# Patient Record
Sex: Female | Born: 1975 | Race: White | Hispanic: No | Marital: Married | Smoking: Never smoker
Health system: Southern US, Community
[De-identification: ages and names within clinical notes are randomized; demographics above are authoritative.]

## PROBLEM LIST (undated history)

## (undated) DIAGNOSIS — F419 Anxiety disorder, unspecified: Secondary | ICD-10-CM

## (undated) DIAGNOSIS — F32A Depression, unspecified: Secondary | ICD-10-CM

## (undated) DIAGNOSIS — G43909 Migraine, unspecified, not intractable, without status migrainosus: Secondary | ICD-10-CM

## (undated) HISTORY — PX: GASTRIC BYPASS: SHX52

---

## 2014-05-15 ENCOUNTER — Other Ambulatory Visit: Payer: Self-pay | Admitting: Obstetrics and Gynecology

## 2014-05-16 ENCOUNTER — Encounter (HOSPITAL_COMMUNITY): Payer: Self-pay | Admitting: Pharmacist

## 2014-05-18 DIAGNOSIS — IMO0002 Reserved for concepts with insufficient information to code with codable children: Secondary | ICD-10-CM

## 2014-05-18 DIAGNOSIS — O09529 Supervision of elderly multigravida, unspecified trimester: Secondary | ICD-10-CM | POA: Diagnosis present

## 2014-05-18 DIAGNOSIS — Z8759 Personal history of other complications of pregnancy, childbirth and the puerperium: Secondary | ICD-10-CM

## 2014-05-18 DIAGNOSIS — O09299 Supervision of pregnancy with other poor reproductive or obstetric history, unspecified trimester: Secondary | ICD-10-CM

## 2014-05-18 DIAGNOSIS — O034 Incomplete spontaneous abortion without complication: Secondary | ICD-10-CM | POA: Diagnosis present

## 2014-05-18 DIAGNOSIS — Z641 Problems related to multiparity: Secondary | ICD-10-CM

## 2014-05-18 DIAGNOSIS — Z8659 Personal history of other mental and behavioral disorders: Secondary | ICD-10-CM

## 2014-05-21 ENCOUNTER — Encounter (HOSPITAL_COMMUNITY): Admission: RE | Payer: Self-pay | Source: Ambulatory Visit

## 2014-05-21 ENCOUNTER — Ambulatory Visit (HOSPITAL_COMMUNITY)
Admission: RE | Admit: 2014-05-21 | Payer: BC Managed Care – PPO | Source: Ambulatory Visit | Admitting: Obstetrics and Gynecology

## 2014-05-21 SURGERY — DILATION AND EVACUATION, UTERUS
Anesthesia: Choice

## 2015-04-28 ENCOUNTER — Inpatient Hospital Stay (HOSPITAL_COMMUNITY): Admission: AD | Admit: 2015-04-28 | Payer: Self-pay | Source: Ambulatory Visit | Admitting: Obstetrics and Gynecology

## 2021-05-01 ENCOUNTER — Emergency Department (HOSPITAL_BASED_OUTPATIENT_CLINIC_OR_DEPARTMENT_OTHER): Payer: Self-pay

## 2021-05-01 ENCOUNTER — Other Ambulatory Visit: Payer: Self-pay

## 2021-05-01 ENCOUNTER — Encounter (HOSPITAL_BASED_OUTPATIENT_CLINIC_OR_DEPARTMENT_OTHER): Payer: Self-pay | Admitting: *Deleted

## 2021-05-01 DIAGNOSIS — W1839XA Other fall on same level, initial encounter: Secondary | ICD-10-CM | POA: Insufficient documentation

## 2021-05-01 DIAGNOSIS — S92354A Nondisplaced fracture of fifth metatarsal bone, right foot, initial encounter for closed fracture: Secondary | ICD-10-CM | POA: Insufficient documentation

## 2021-05-01 DIAGNOSIS — Y9301 Activity, walking, marching and hiking: Secondary | ICD-10-CM | POA: Insufficient documentation

## 2021-05-01 NOTE — ED Triage Notes (Signed)
States she rolled her foot tonight and heard a "crunch". Swelling noted to lateral right foot

## 2021-05-02 ENCOUNTER — Emergency Department (HOSPITAL_BASED_OUTPATIENT_CLINIC_OR_DEPARTMENT_OTHER)
Admission: EM | Admit: 2021-05-02 | Discharge: 2021-05-02 | Disposition: A | Discharge status: Home / Self Care | Payer: Self-pay | Attending: Emergency Medicine | Admitting: Emergency Medicine

## 2021-05-02 DIAGNOSIS — S92354A Nondisplaced fracture of fifth metatarsal bone, right foot, initial encounter for closed fracture: Secondary | ICD-10-CM

## 2021-05-02 HISTORY — DX: Migraine, unspecified, not intractable, without status migrainosus: G43.909

## 2021-05-02 MED ORDER — HYDROCODONE-ACETAMINOPHEN 5-325 MG PO TABS: 1.0000 | ORAL_TABLET | ORAL | 0 refills | Status: DC | PRN

## 2021-05-02 MED ORDER — HYDROCODONE-ACETAMINOPHEN 5-325 MG PO TABS
1.0000 | ORAL_TABLET | Freq: Once | ORAL | Status: AC
Start: 2021-05-02 — End: 2021-05-02
  Administered 2021-05-02: 1 via ORAL
  Filled 2021-05-02: qty 1

## 2021-05-02 NOTE — ED Provider Notes (Signed)
[facility] Emergency Department Provider Note MRN:  [id number]  Arrival date & time: 05/02/21     Chief Complaint   Foot Injury   History of Present Illness   Natasha Cannon is a 45 y.o. year-old female with no pertinent past medical history presenting to the ED with chief complaint of foot injury.  Patient was walking and stepped down off a curb and landed awkwardly on the right foot causing a pain and crunching sound to the lateral aspect of the right foot.  Issues with ambulating since the incident.  Denies any other trauma, did not hit head.  Pain is 7 out of 10, constant, worse with motion or palpation.  Review of Systems  A problem-focused ROS was performed. Positive for foot pain.  Patient denies head trauma.  Patient's Health History    Past Medical History:  Diagnosis Date   Migraine     Past Surgical History:  Procedure Laterality Date   GASTRIC BYPASS      No family history on file.  Social History   Socioeconomic History   Marital status: Married    Spouse name: Not on file   Number of children: Not on file   Years of education: Not on file   Highest education level: Not on file  Occupational History   Not on file  Tobacco Use   Smoking status: Never   Smokeless tobacco: Never  Substance and Sexual Activity   Alcohol use: Yes   Drug use: Never   Sexual activity: Not on file  Other Topics Concern   Not on file  Social History Narrative   Not on file   Social Determinants of Health   Financial Resource Strain: Not on file  Food Insecurity: Not on file  Transportation Needs: Not on file  Physical Activity: Not on file  Stress: Not on file  Social Connections: Not on file  Intimate Partner Violence: Not on file     Physical Exam   Vitals:   05/02/21 0047 05/02/21 0048  BP:  114/68  Pulse:  84  Resp:  18  Temp:  97.7 F (36.5 C)  SpO2: 100% 100%    CONSTITUTIONAL: Well-appearing, NAD NEURO:  Alert and  oriented x 3, no focal deficits EYES:  eyes equal and reactive ENT/NECK:  no LAD, no JVD CARDIO: Regular rate, well-perfused, normal S1 and S2 PULM:  CTAB no wheezing or rhonchi GI/GU:  normal bowel sounds, non-distended, non-tender MSK/SPINE: Swelling and tenderness and bruising to the right foot at the base of the fifth metatarsal SKIN:  no rash, atraumatic PSYCH:  Appropriate speech and behavior  *Additional and/or pertinent findings included in MDM below  Diagnostic and Interventional Summary    EKG Interpretation  Date/Time:    Ventricular Rate:    PR Interval:    QRS Duration:   QT Interval:    QTC Calculation:   R Axis:     Text Interpretation:          Labs Reviewed - No data to display  DG Foot Complete Right  Final Result      Medications  HYDROcodone-acetaminophen (NORCO/VICODIN) 5-325 MG per tablet 1 tablet (1 tablet Oral Given 05/02/21 0042)     Procedures  /  Critical Care Procedures  ED Course and Medical Decision Making  I have reviewed the triage vital signs, the nursing notes, and pertinent available records from the EMR.  Listed above are laboratory and imaging tests that I personally ordered,  reviewed, and interpreted and then considered in my medical decision making (see below for details).  X-ray confirms fracture of the base of the fifth metatarsal, nondisplaced.  Will place in splint and refer to orthopedics.  Limb is neurovascularly intact.  No other injuries.       Elmer Sow. Pilar Plate, MD [facility] Emergency Medicine [facility] [email or web address]  Final Clinical Impressions(s) / ED Diagnoses     ICD-10-CM   1. Closed nondisplaced fracture of fifth metatarsal bone of right foot, initial encounter  S92.354A       ED Discharge Orders          Ordered    HYDROcodone-acetaminophen (NORCO/VICODIN) 5-325 MG tablet  Every 4 hours PRN        05/02/21 0104             Discharge Instructions Discussed with and  Provided to Patient:    Discharge Instructions      You were evaluated in the Emergency Department and after careful evaluation, we did not find any emergent condition requiring admission or further testing in the hospital.  Your exam/testing today was overall reassuring.  You have a broken bone in your foot.  Please keep the splint on and use the crutches and do not put any weight on the foot until you are seen by the orthopedic specialists.  Recommend Tylenol or Motrin for discomfort, can use the Norco tablets for more significant pain.  Please return to the Emergency Department if you experience any worsening of your condition.  Thank you for allowing [place] to be a part of your care.        Sabas Sous, MD 05/02/21 [phone]

## 2021-05-02 NOTE — Discharge Instructions (Addendum)
You were evaluated in the Emergency Department and after careful evaluation, we did not find any emergent condition requiring admission or further testing in the hospital.  Your exam/testing today was overall reassuring.  You have a broken bone in your foot.  Please keep the splint on and use the crutches and do not put any weight on the foot until you are seen by the orthopedic specialists.  Recommend Tylenol or Motrin for discomfort, can use the Norco tablets for more significant pain.  Please return to the Emergency Department if you experience any worsening of your condition.  Thank you for allowing [place] to be a part of your care.

## 2021-05-03 ENCOUNTER — Ambulatory Visit (INDEPENDENT_AMBULATORY_CARE_PROVIDER_SITE_OTHER): Admitting: Orthopaedic Surgery

## 2021-05-03 ENCOUNTER — Encounter: Payer: Self-pay | Admitting: Orthopaedic Surgery

## 2021-05-03 ENCOUNTER — Other Ambulatory Visit: Payer: Self-pay

## 2021-05-03 DIAGNOSIS — S92354A Nondisplaced fracture of fifth metatarsal bone, right foot, initial encounter for closed fracture: Secondary | ICD-10-CM | POA: Diagnosis not present

## 2021-05-03 DIAGNOSIS — S92309A Fracture of unspecified metatarsal bone(s), unspecified foot, initial encounter for closed fracture: Secondary | ICD-10-CM | POA: Insufficient documentation

## 2021-05-03 MED ORDER — HYDROCODONE-ACETAMINOPHEN 5-325 MG PO TABS: 1.0000 | ORAL_TABLET | Freq: Four times a day (QID) | ORAL | 0 refills | Status: DC | PRN

## 2021-05-03 NOTE — Progress Notes (Signed)
Office Visit Note   Patient: Natasha Cannon           Date of Birth: [birth year]           MRN: [id number] Visit Date: 05/03/2021              Requested by: Burnis Medin, PA-C [address] [place],  [place] [address] PCP: Piedad Climes, [place], [place]   Assessment & Plan: Visit Diagnoses:  1. Closed nondisplaced fracture of fifth metatarsal bone of right foot, initial encounter     Plan: Cam boot applied we sent in some Norco at her request for pain.  Elevation.  She can remove the cam boot to wash her foot.  Recheck 3 weeks.  No repeat neck x-ray needed on return, she is slipped fallen or had increased pain.  Follow-Up Instructions: Return in about 3 weeks (around 05/24/2021).   Orders:  No orders of the defined types were placed in this encounter.  Meds ordered this encounter  Medications   HYDROcodone-acetaminophen (NORCO/VICODIN) 5-325 MG tablet    Sig: Take 1-2 tablets by mouth every 6 (six) hours as needed for moderate pain.    Dispense:  20 tablet    Refill:  0      Procedures: No procedures performed   Clinical Data: No additional findings.   Subjective: Chief Complaint  Patient presents with   Right Foot - Pain    HPI 45 year old female was coming back from a [employer] function with her children was walking through the grass in the dark rolled her ankle with sharp pain laterally over the fifth metatarsal.  Ice elevation and x-rays were obtained at [facility]  which showed nondisplaced fifth metatarsal fracture proximally.  No past history of injury.  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  Review of Systems patient normally active does [employer].   Objective: Vital Signs: LMP 04/03/2021   Physical Exam Constitutional:      Appearance: She is well-developed.  HENT:     Head: Normocephalic.     Right Ear: External ear normal.     Left Ear: External ear normal. There is no impacted cerumen.   Eyes:     Pupils: Pupils are equal, round, and reactive to light.  Neck:     Thyroid: No thyromegaly.     Trachea: No tracheal deviation.  Cardiovascular:     Rate and Rhythm: Normal rate.  Pulmonary:     Effort: Pulmonary effort is normal.  Abdominal:     Palpations: Abdomen is soft.  Musculoskeletal:     Cervical back: No rigidity.  Skin:    General: Skin is warm and dry.  Neurological:     Mental Status: She is alert and oriented to person, place, and time.  Psychiatric:        Behavior: Behavior normal.    Ortho Exam patient is a short leg splint has crutches.  Closed injury swelling tenderness base of the fifth metatarsal.  Specialty Comments:  No specialty comments available.  Imaging: Narrative & Impression  CLINICAL DATA:  Rolled the foot tonight and felt a pop. Swelling to the lateral aspect. Unable to bear weight.   EXAM: RIGHT FOOT COMPLETE - 3+ VIEW   COMPARISON:  None.   FINDINGS: Transverse fracture of the base of the fifth metatarsal bone with overlying soft tissue swelling. Right foot appears otherwise intact. Joint spaces are normal.   IMPRESSION: Transverse nondisplaced  fracture of the base of the fifth metatarsal bone with overlying soft tissue swelling.     Electronically Signed   By: Burman Nieves M.D.   On: 05/02/2021 00:06       PMFS History: Patient Active Problem List   Diagnosis Date Noted   Metatarsal fracture 05/03/2021   Grand multipara 05/18/2014   Elderly multigravida with antepartum condition or complication 05/18/2014   Incomplete abortion--04/2014 05/18/2014   History of postpartum depression x 2 05/18/2014   LGA (large for gestational age) fetus--hx 2 9 lb infants, 1 10 lb infant. 05/18/2014   Hx of postpartum hemorrhage x 1 05/18/2014   Past Medical History:  Diagnosis Date   Migraine     History reviewed. No pertinent family history.  Past Surgical History:  Procedure Laterality Date   GASTRIC BYPASS      Social History   Occupational History   Not on file  Tobacco Use   Smoking status: Never   Smokeless tobacco: Never  Substance and Sexual Activity   Alcohol use: Yes   Drug use: Never   Sexual activity: Not on file

## 2021-05-07 ENCOUNTER — Telehealth: Payer: Self-pay | Admitting: Orthopaedic Surgery

## 2021-05-07 ENCOUNTER — Other Ambulatory Visit: Payer: Self-pay | Admitting: Surgery

## 2021-05-07 MED ORDER — HYDROCODONE-ACETAMINOPHEN 5-325 MG PO TABS: 1.0000 | ORAL_TABLET | Freq: Two times a day (BID) | ORAL | 0 refills | Status: DC | PRN

## 2021-05-07 NOTE — Telephone Encounter (Signed)
Pt calling to get a refill on oxycodone prescription as she is on her last pill today, best pharmacy is [facility] in [place]. Pt also had questions and states boot might be too big as she can feel it moving at certain times when she walks around and can feel "the bones shifting". The best call back number is [phone].

## 2021-05-07 NOTE — Telephone Encounter (Signed)
Can you please advise on refill for hydrocodone?  

## 2021-05-07 NOTE — Telephone Encounter (Signed)
Pt was called and advised and stated understanding 

## 2021-05-10 ENCOUNTER — Telehealth: Payer: Self-pay

## 2021-05-10 NOTE — Telephone Encounter (Signed)
Pt called stating that she has been wearing her boot non stop. She took it off to take a shower and wash her foot and she noticed bruising on her heel and swelling on the inside of her foot. She also stated that she cant move a few of her toes She also stated that she Is in more pain now than she was last week

## 2021-05-10 NOTE — Telephone Encounter (Signed)
Please advise. Would you like for me to bring patient back in and get new xrays?

## 2021-05-10 NOTE — Telephone Encounter (Signed)
I called patient and advised. She states that she is trying to elevate some, however, that is difficult because she has 7 children and has to be up some. I explained that the pain will be worse if she is not elevating and has increased swelling. She did not have return appt in three weeks so we scheduled that appointment today as well. She will call back if continued problems/questions.

## 2021-05-14 ENCOUNTER — Other Ambulatory Visit: Payer: Self-pay | Admitting: Orthopaedic Surgery

## 2021-05-14 ENCOUNTER — Telehealth: Payer: Self-pay | Admitting: Orthopaedic Surgery

## 2021-05-14 MED ORDER — HYDROCODONE-ACETAMINOPHEN 5-325 MG PO TABS: 1.0000 | ORAL_TABLET | Freq: Four times a day (QID) | ORAL | 0 refills | Status: DC | PRN

## 2021-05-14 NOTE — Telephone Encounter (Signed)
Pt called wanting to make sure she wasn't forgotten; I let her know as soon as we got a response from a DR we would be calling her back.   [phone]

## 2021-05-14 NOTE — Telephone Encounter (Signed)
Patient called. She would like a refill on hydrocodone called in for her. Her call back number is [phone]

## 2021-05-14 NOTE — Telephone Encounter (Signed)
Can you please advise since Dr. Yates is out of the office? 

## 2021-05-26 ENCOUNTER — Encounter: Payer: Self-pay | Admitting: Orthopaedic Surgery

## 2021-05-26 ENCOUNTER — Ambulatory Visit (INDEPENDENT_AMBULATORY_CARE_PROVIDER_SITE_OTHER): Admitting: Orthopaedic Surgery

## 2021-05-26 ENCOUNTER — Other Ambulatory Visit: Payer: Self-pay

## 2021-05-26 VITALS — Ht 67.0 in | Wt 165.0 lb

## 2021-05-26 DIAGNOSIS — S92354D Nondisplaced fracture of fifth metatarsal bone, right foot, subsequent encounter for fracture with routine healing: Secondary | ICD-10-CM

## 2021-05-26 MED ORDER — HYDROCODONE-ACETAMINOPHEN 5-325 MG PO TABS: 1.0000 | ORAL_TABLET | Freq: Four times a day (QID) | ORAL | 0 refills | Status: DC | PRN

## 2021-05-26 NOTE — Progress Notes (Signed)
Office Visit Note   Patient: Natasha Cannon           Date of Birth: 01-22-1976           MRN: [id number] Visit Date: 05/26/2021              Requested by: Burnis Medin, PA-C [address] [place],  [place] [address] PCP: Piedad Climes, [place], [place]   Assessment & Plan: Visit Diagnoses:  1. Closed nondisplaced fracture of fifth metatarsal bone of right foot with routine healing, subsequent encounter     Plan: Recheck 1 month.  Three-view x-rays right foot on return.  Continue cam boot.  Norco 20 tablets prescribed.  Follow-Up Instructions: Return in about 1 month (around 06/26/2021).   Orders:  No orders of the defined types were placed in this encounter.  Meds ordered this encounter  Medications   HYDROcodone-acetaminophen (NORCO/VICODIN) 5-325 MG tablet    Sig: Take 1 tablet by mouth every 6 (six) hours as needed for moderate pain.    Dispense:  20 tablet    Refill:  0      Procedures: No procedures performed   Clinical Data: No additional findings.   Subjective: Chief Complaint  Patient presents with   Right Foot - Fracture, Follow-up    DOI 05/02/2021    HPI follow-up now almost 1 month post fifth metatarsal fracture.  Patient is in a cam boot she has 7 children.  She has been on her feet a lot and very active as expected.  Review of Systems updated unchanged.   Objective: Vital Signs: Ht 5\' 7"  (1.702 m)   Wt 165 lb (74.8 kg)   BMI 25.84 kg/m   Physical Exam Constitutional:      Appearance: She is well-developed.  HENT:     Head: Normocephalic.     Right Ear: External ear normal.     Left Ear: External ear normal. There is no impacted cerumen.  Eyes:     Pupils: Pupils are equal, round, and reactive to light.  Neck:     Thyroid: No thyromegaly.     Trachea: No tracheal deviation.  Cardiovascular:     Rate and Rhythm: Normal rate.  Pulmonary:     Effort: Pulmonary effort is normal.  Abdominal:     Palpations: Abdomen  is soft.  Musculoskeletal:     Cervical back: No rigidity.  Skin:    General: Skin is warm and dry.  Neurological:     Mental Status: She is alert and oriented to person, place, and time.  Psychiatric:        Behavior: Behavior normal.    Ortho Exam ecchymosis laterally by the calcaneus is resolved.  She still tender over the fracture site.  Tenderness over dorsal midfoot spur where she is adding an extra pad for the cam boot.  No plantar foot lesions.  Good capillary refill.  Specialty Comments:  No specialty comments available.  Imaging: No results found.   PMFS History: Patient Active Problem List   Diagnosis Date Noted   Metatarsal fracture 05/03/2021   Grand multipara 05/18/2014   Elderly multigravida with antepartum condition or complication 05/18/2014   Incomplete abortion--04/2014 05/18/2014   History of postpartum depression x 2 05/18/2014   LGA (large for gestational age) fetus--hx 2 9 lb infants, 1 10 lb infant. 05/18/2014   Hx of postpartum hemorrhage x 1 05/18/2014   Past Medical History:  Diagnosis Date   Migraine  No family history on file.  Past Surgical History:  Procedure Laterality Date   GASTRIC BYPASS     Social History   Occupational History   Not on file  Tobacco Use   Smoking status: Never   Smokeless tobacco: Never  Substance and Sexual Activity   Alcohol use: Yes   Drug use: Never   Sexual activity: Not on file

## 2021-06-29 ENCOUNTER — Ambulatory Visit: Admitting: Orthopaedic Surgery

## 2021-10-29 ENCOUNTER — Other Ambulatory Visit: Payer: Self-pay

## 2021-10-29 ENCOUNTER — Encounter (HOSPITAL_BASED_OUTPATIENT_CLINIC_OR_DEPARTMENT_OTHER): Payer: Self-pay

## 2021-10-29 ENCOUNTER — Emergency Department (HOSPITAL_BASED_OUTPATIENT_CLINIC_OR_DEPARTMENT_OTHER)
Admission: EM | Admit: 2021-10-29 | Discharge: 2021-10-29 | Disposition: A | Discharge status: Home / Self Care | Attending: Emergency Medicine | Admitting: Emergency Medicine

## 2021-10-29 DIAGNOSIS — N766 Ulceration of vulva: Secondary | ICD-10-CM | POA: Diagnosis not present

## 2021-10-29 DIAGNOSIS — R300 Dysuria: Secondary | ICD-10-CM

## 2021-10-29 LAB — WET PREP, GENITAL
Sperm: NONE SEEN
Trich, Wet Prep: NONE SEEN
WBC, Wet Prep HPF POC: >=10 — AB (ref <10)
Yeast Wet Prep HPF POC: NONE SEEN

## 2021-10-29 LAB — URINALYSIS, ROUTINE W REFLEX MICROSCOPIC
Bilirubin Urine: NEGATIVE
Glucose, UA: NEGATIVE mg/dL
Ketones, ur: NEGATIVE mg/dL
Nitrite: NEGATIVE
Protein, ur: NEGATIVE mg/dL
Specific Gravity, Urine: >=1.03 (ref 1.005–1.030)
pH: 6 (ref 5.0–8.0)

## 2021-10-29 LAB — URINALYSIS, MICROSCOPIC (REFLEX)

## 2021-10-29 LAB — PREGNANCY, URINE: Preg Test, Ur: NEGATIVE

## 2021-10-29 MED ORDER — LIDOCAINE HCL 2 % EX GEL: 1.0000 "application " | CUTANEOUS | 1 refills | Status: DC | PRN

## 2021-10-29 MED ORDER — HYDROCODONE-ACETAMINOPHEN 5-325 MG PO TABS: 1.0000 | ORAL_TABLET | Freq: Four times a day (QID) | ORAL | 0 refills | Status: DC | PRN

## 2021-10-29 NOTE — Discharge Instructions (Signed)
Please read and follow all provided instructions.  Your diagnoses today include:  1. Vulvar ulceration   2. Dysuria     Tests performed today include: Wet prep: No yeast, BV, trichomonas vaginal infection Urine test: No compelling UTI Vital signs. See below for your results today.   Medications prescribed:  Lidocaine jelly  Vicodin (hydrocodone/acetaminophen) - narcotic pain medication  DO NOT drive or perform any activities that require you to be awake and alert because this medicine can make you drowsy. BE VERY CAREFUL not to take multiple medicines containing Tylenol (also called acetaminophen). Doing so can lead to an overdose which can damage your liver and cause liver failure and possibly death.  Take any prescribed medications only as directed.  Home care instructions:  Follow any educational materials contained in this packet.  Follow-up instructions: Please call your OB/GYN to schedule a follow-up appointment.  Return instructions:  Please return to the Emergency Department if you experience worsening symptoms.  Return with worsening pain, fever. Please return if you have any other emergent concerns.  Additional Information:  Your vital signs today were: BP 113/82    Pulse 70    Temp 98.2 F (36.8 C) (Oral)    Resp 18    Ht 5\' 7"  (1.702 m)    Wt 82.1 kg    LMP 10/21/2021    SpO2 100%    BMI 28.35 kg/m  If your blood pressure (BP) was elevated above 135/85 this visit, please have this repeated by your doctor within one month. --------------

## 2021-10-29 NOTE — ED Provider Notes (Signed)
[facility] EMERGENCY DEPARTMENT Provider Note   CSN: [id number] Arrival date & time: 10/29/21  1336     History Chief Complaint  Patient presents with   Dysuria    Natasha Cannon is a 45 y.o. female.  Patient presents the emergency department for evaluation of dysuria.  Patient has had pelvic pain with dysuria over the past several days.  Was seen by provider yesterday and started on Cipro and Diflucan.  She reports that starting yesterday after her visit, she developed several sores externally, bilaterally.  Pain feels external to the patient.  No associated fevers, vomiting.  She has had some nausea and chills.  She was concerned about kidney stones with denies severe flank pain.  Patient is married with 1 partner. The onset of this condition was acute. The course is constant. Aggravating factors: urination. Alleviating factors: none.        Past Medical History:  Diagnosis Date   Migraine     Patient Active Problem List   Diagnosis Date Noted   Metatarsal fracture 05/03/2021   Grand multipara 05/18/2014   Elderly multigravida with antepartum condition or complication [id number]   Incomplete abortion--04/2014 05/18/2014   History of postpartum depression x 2 05/18/2014   LGA (large for gestational age) fetus--hx 2 9 lb infants, 1 10 lb infant. 05/18/2014   Hx of postpartum hemorrhage x 1 05/18/2014    Past Surgical History:  Procedure Laterality Date   GASTRIC BYPASS       OB History   No obstetric history on file.     No family history on file.  Social History   Tobacco Use   Smoking status: Never   Smokeless tobacco: Never  Substance Use Topics   Alcohol use: Yes    Comment: occ   Drug use: Never    Home Medications Prior to Admission medications   Medication Sig Start Date End Date Taking? Authorizing Provider  HYDROcodone-acetaminophen (NORCO/VICODIN) 5-325 MG tablet Take 1 tablet by mouth every 6 (six) hours as needed for moderate  pain. 05/14/21   Mcarthur Rossetti, MD  HYDROcodone-acetaminophen (NORCO/VICODIN) 5-325 MG tablet Take 1 tablet by mouth every 6 (six) hours as needed for moderate pain. 05/26/21   Marybelle Killings, MD  loratadine (CLARITIN) 10 MG tablet Take 10 mg by mouth daily as needed for allergies.    [provider]    Allergies    Penicillins  Review of Systems   Review of Systems  Constitutional:  Positive for chills. Negative for fever.  HENT:  Negative for rhinorrhea and sore throat.   Eyes:  Negative for redness.  Respiratory:  Negative for cough.   Cardiovascular:  Negative for chest pain.  Gastrointestinal:  Positive for nausea. Negative for abdominal pain, diarrhea and vomiting.  Genitourinary:  Positive for dysuria and genital sores. Negative for frequency, hematuria, urgency, vaginal bleeding and vaginal discharge.  Musculoskeletal:  Negative for myalgias.  Skin:  Negative for rash.  Neurological:  Negative for headaches.   Physical Exam Updated Vital Signs BP (!) 124/92 (BP Location: Left Arm)    Pulse 75    Temp 98.2 F (36.8 C) (Oral)    Resp 18    Ht 5\' 7"  (1.702 m)    Wt 82.1 kg    LMP 10/21/2021    SpO2 100%    BMI 28.35 kg/m   Physical Exam Vitals and nursing note reviewed. Exam conducted with a chaperone present.  Constitutional:      General:  She is not in acute distress.    Appearance: She is well-developed.  HENT:     Head: Normocephalic and atraumatic.     Right Ear: External ear normal.     Left Ear: External ear normal.     Nose: Nose normal.  Eyes:     Conjunctiva/sclera: Conjunctivae normal.  Cardiovascular:     Rate and Rhythm: Normal rate and regular rhythm.     Heart sounds: No murmur heard. Pulmonary:     Effort: No respiratory distress.     Breath sounds: No wheezing, rhonchi or rales.  Abdominal:     Palpations: Abdomen is soft.     Tenderness: There is no abdominal tenderness. There is no guarding or rebound.  Genitourinary:    Exam  position: Lithotomy position.     Labia:        Right: Tenderness and lesion present. No injury.        Left: Tenderness and lesion present. No injury.      Urethra: Urethral lesion present.     Cervix: No friability.     Comments: Unable to perform speculum or bimanual exam due to tenderness.  Cervix very anterior, no obvious friability.  Patient with multiple small scattered ulcerations over the vulva and clitoral hood. Musculoskeletal:     Cervical back: Normal range of motion and neck supple.     Right lower leg: No edema.     Left lower leg: No edema.  Skin:    General: Skin is warm and dry.     Findings: No rash.  Neurological:     General: No focal deficit present.     Mental Status: She is alert. Mental status is at baseline.     Motor: No weakness.  Psychiatric:        Mood and Affect: Mood normal.    ED Results / Procedures / Treatments   Labs (all labs ordered are listed, but only abnormal results are displayed) Labs Reviewed  WET PREP, GENITAL - Abnormal; Notable for the following components:      Result Value   Clue Cells Wet Prep HPF POC PRESENT (*)    WBC, Wet Prep HPF POC >=10 (*)    All other components within normal limits  URINALYSIS, ROUTINE W REFLEX MICROSCOPIC - Abnormal; Notable for the following components:   Hgb urine dipstick TRACE (*)    Leukocytes,Ua TRACE (*)    All other components within normal limits  URINALYSIS, MICROSCOPIC (REFLEX) - Abnormal; Notable for the following components:   Bacteria, UA RARE (*)    All other components within normal limits  PREGNANCY, URINE  GC/CHLAMYDIA PROBE AMP ([place]) NOT AT [facility]    EKG None  Radiology No results found.  Procedures Procedures   Medications Ordered in ED Medications - No data to display  ED Course  I have reviewed the triage vital signs and the nursing notes.  Pertinent labs & imaging results that were available during my care of the patient were reviewed by me and  considered in my medical decision making (see chart for details).  Patient seen and examined.  Pelvic exam performed with chaperone.  Vital signs reviewed and are as follows: BP 113/82    Pulse 70    Temp 98.2 F (36.8 C) (Oral)    Resp 18    Ht 5\' 7"  (1.702 m)    Wt 82.1 kg    LMP 10/21/2021    SpO2 100%    BMI  28.35 kg/m   Discussed with Dr. Lockie Mola.  Patient with vulvovaginal ulcerations, unknown etiology.  Will provide symptomatic control with lidocaine jelly and oral pain medication.  Strongly encouraged OB/GYN follow-up.  Patient has an OB/GYN to follow-up with.  Otherwise, abdomen is soft and nontender without rebound or guarding.  Do not feel that she requires advanced imaging at this time.  The patient was urged to return to the Emergency Department immediately with worsening of current symptoms, worsening abdominal pain, persistent vomiting, blood noted in stools, fever, or any other concerns. The patient verbalized understanding.      MDM Rules/Calculators/A&P                            Vulvar ulcerations, likely viral.  Patient is low risk for STI per history.  We will treat symptomatically and have her follow-up with OB/GYN.  Exam is otherwise reassuring.  Patient is well, nontoxic.  UA without definitive UTI.  She was started on Cipro yesterday.  Patient given option to continue this or wait culture results and by other provider.  Encouraged return as above.    Final Clinical Impression(s) / ED Diagnoses Final diagnoses:  Vulvar ulceration  Dysuria    Rx / DC Orders ED Discharge Orders     None        Renne Crigler, PA-C 10/29/21 1608    Margarita Grizzle, MD 10/30/21 [phone]

## 2021-10-29 NOTE — ED Triage Notes (Signed)
Pt c/o dysuria x 4 days-states she was seen by PCP yesterday-states she now has sores to vaginal area and feels pain is r/t to when urine runs across sores-also c/o lower abd pain and lower back pain x 3 days-NAD-steady gait

## 2021-10-29 NOTE — ED Notes (Signed)
Seen by MD yesterday. Given cipro and diflucan. Pain worse overnight. C/o abd/pelvic/back pain, and vaginal lesion burning, worse with voiding. Reports thin, yellow vaginal d/c. Denies bleeding. Also reports nausea, dizziness, and chills. Denies fever. Steady gait.

## 2021-11-01 LAB — GC/CHLAMYDIA PROBE AMP (~~LOC~~) NOT AT ARMC
Chlamydia: NEGATIVE
Comment: NEGATIVE
Comment: NORMAL
Neisseria Gonorrhea: NEGATIVE

## 2021-11-30 ENCOUNTER — Ambulatory Visit (HOSPITAL_COMMUNITY): Payer: Self-pay | Admitting: Psychiatry

## 2023-03-13 ENCOUNTER — Encounter (HOSPITAL_BASED_OUTPATIENT_CLINIC_OR_DEPARTMENT_OTHER): Payer: Self-pay

## 2023-03-13 ENCOUNTER — Other Ambulatory Visit: Payer: Self-pay

## 2023-03-13 DIAGNOSIS — F10930 Alcohol use, unspecified with withdrawal, uncomplicated: Secondary | ICD-10-CM | POA: Insufficient documentation

## 2023-03-13 DIAGNOSIS — F419 Anxiety disorder, unspecified: Secondary | ICD-10-CM | POA: Insufficient documentation

## 2023-03-13 DIAGNOSIS — Y906 Blood alcohol level of 120-199 mg/100 ml: Secondary | ICD-10-CM | POA: Insufficient documentation

## 2023-03-13 DIAGNOSIS — R110 Nausea: Secondary | ICD-10-CM | POA: Insufficient documentation

## 2023-03-13 DIAGNOSIS — E876 Hypokalemia: Secondary | ICD-10-CM | POA: Diagnosis not present

## 2023-03-13 DIAGNOSIS — I493 Ventricular premature depolarization: Secondary | ICD-10-CM | POA: Diagnosis not present

## 2023-03-13 DIAGNOSIS — F10929 Alcohol use, unspecified with intoxication, unspecified: Secondary | ICD-10-CM | POA: Insufficient documentation

## 2023-03-13 DIAGNOSIS — R002 Palpitations: Secondary | ICD-10-CM | POA: Diagnosis present

## 2023-03-13 DIAGNOSIS — E860 Dehydration: Secondary | ICD-10-CM | POA: Diagnosis not present

## 2023-03-13 DIAGNOSIS — R630 Anorexia: Secondary | ICD-10-CM | POA: Insufficient documentation

## 2023-03-13 LAB — CBC
HCT: 39 % (ref 36.0–46.0)
Hemoglobin: 12.8 g/dL (ref 12.0–15.0)
MCH: 29.1 pg (ref 26.0–34.0)
MCHC: 32.8 g/dL (ref 30.0–36.0)
MCV: 88.6 fL (ref 80.0–100.0)
Platelets: 224 10*3/uL (ref 150–400)
RBC: 4.4 MIL/uL (ref 3.87–5.11)
RDW: 14.1 % (ref 11.5–15.5)
WBC: 4.3 10*3/uL (ref 4.0–10.5)
nRBC: 0 % (ref 0.0–0.2)

## 2023-03-13 LAB — COMPREHENSIVE METABOLIC PANEL
ALT: 98 U/L — ABNORMAL HIGH (ref 0–44)
AST: 202 U/L — ABNORMAL HIGH (ref 15–41)
Albumin: 4.2 g/dL (ref 3.5–5.0)
Alkaline Phosphatase: 71 U/L (ref 38–126)
Anion gap: 14 (ref 5–15)
BUN: 9 mg/dL (ref 6–20)
CO2: 22 mmol/L (ref 22–32)
Calcium: 8.1 mg/dL — ABNORMAL LOW (ref 8.9–10.3)
Chloride: 100 mmol/L (ref 98–111)
Creatinine, Ser: 0.66 mg/dL (ref 0.44–1.00)
GFR, Estimated: >60 mL/min (ref >60)
Glucose, Bld: 105 mg/dL — ABNORMAL HIGH (ref 70–99)
Potassium: 3.5 mmol/L (ref 3.5–5.1)
Sodium: 136 mmol/L (ref 135–145)
Total Bilirubin: 1.3 mg/dL — ABNORMAL HIGH (ref 0.3–1.2)
Total Protein: 7.7 g/dL (ref 6.5–8.1)

## 2023-03-13 LAB — ETHANOL: Alcohol, Ethyl (B): 194 mg/dL — ABNORMAL HIGH (ref <10)

## 2023-03-13 NOTE — ED Triage Notes (Addendum)
Pt states she is withdrawing from Alcohol - last drank 12 hours ago - had 4 liquor drinks this morning.  Pt states she is having "palpitations, back pain , diff breathing, anxiety, dehydration, nausea, not eating"  Pt also having dysuria and wants to be tested for UTI

## 2023-03-14 ENCOUNTER — Emergency Department (HOSPITAL_BASED_OUTPATIENT_CLINIC_OR_DEPARTMENT_OTHER)
Admission: EM | Admit: 2023-03-14 | Discharge: 2023-03-14 | Disposition: A | Discharge status: Home / Self Care | Source: Home / Self Care | Attending: Emergency Medicine | Admitting: Emergency Medicine

## 2023-03-14 ENCOUNTER — Emergency Department (HOSPITAL_BASED_OUTPATIENT_CLINIC_OR_DEPARTMENT_OTHER)
Admission: EM | Admit: 2023-03-14 | Discharge: 2023-03-14 | Disposition: A | Discharge status: Home / Self Care | Attending: Emergency Medicine | Admitting: Emergency Medicine

## 2023-03-14 ENCOUNTER — Encounter (HOSPITAL_BASED_OUTPATIENT_CLINIC_OR_DEPARTMENT_OTHER): Payer: Self-pay | Admitting: Emergency Medicine

## 2023-03-14 DIAGNOSIS — Y909 Presence of alcohol in blood, level not specified: Secondary | ICD-10-CM | POA: Insufficient documentation

## 2023-03-14 DIAGNOSIS — E876 Hypokalemia: Secondary | ICD-10-CM | POA: Insufficient documentation

## 2023-03-14 DIAGNOSIS — F10930 Alcohol use, unspecified with withdrawal, uncomplicated: Secondary | ICD-10-CM

## 2023-03-14 DIAGNOSIS — F10920 Alcohol use, unspecified with intoxication, uncomplicated: Secondary | ICD-10-CM

## 2023-03-14 DIAGNOSIS — R002 Palpitations: Secondary | ICD-10-CM | POA: Insufficient documentation

## 2023-03-14 DIAGNOSIS — I493 Ventricular premature depolarization: Secondary | ICD-10-CM

## 2023-03-14 DIAGNOSIS — E860 Dehydration: Secondary | ICD-10-CM

## 2023-03-14 HISTORY — DX: Anxiety disorder, unspecified: F41.9

## 2023-03-14 HISTORY — DX: Depression, unspecified: F32.A

## 2023-03-14 LAB — CK: Total CK: 113 U/L (ref 38–234)

## 2023-03-14 LAB — URINALYSIS, ROUTINE W REFLEX MICROSCOPIC
Bilirubin Urine: NEGATIVE
Glucose, UA: NEGATIVE mg/dL
Leukocytes,Ua: NEGATIVE
Nitrite: NEGATIVE
Specific Gravity, Urine: 1.025 (ref 1.005–1.030)
pH: 5.5 (ref 5.0–8.0)

## 2023-03-14 LAB — URINALYSIS, MICROSCOPIC (REFLEX)

## 2023-03-14 LAB — RAPID URINE DRUG SCREEN, HOSP PERFORMED
Amphetamines: NOT DETECTED
Barbiturates: NOT DETECTED
Benzodiazepines: NOT DETECTED
Cocaine: NOT DETECTED
Opiates: NOT DETECTED
Tetrahydrocannabinol: NOT DETECTED

## 2023-03-14 LAB — ETHANOL: Alcohol, Ethyl (B): <10 mg/dL (ref <10)

## 2023-03-14 LAB — PREGNANCY, URINE: Preg Test, Ur: NEGATIVE

## 2023-03-14 LAB — CALCIUM: Calcium: 7.7 mg/dL — ABNORMAL LOW (ref 8.9–10.3)

## 2023-03-14 MED ORDER — CALCIUM GLUCONATE-NACL 1-0.675 GM/50ML-% IV SOLN
1.0000 g | Freq: Once | INTRAVENOUS | Status: AC
  Administered 2023-03-14: 1000 mg via INTRAVENOUS
  Filled 2023-03-14: qty 50

## 2023-03-14 MED ORDER — CHLORDIAZEPOXIDE HCL 25 MG PO CAPS: ORAL_CAPSULE | ORAL | 0 refills | Status: AC

## 2023-03-14 MED ORDER — LORAZEPAM 2 MG/ML IJ SOLN
2.0000 mg | Freq: Once | INTRAMUSCULAR | Status: AC
  Administered 2023-03-14: 2 mg via INTRAVENOUS
  Filled 2023-03-14: qty 1

## 2023-03-14 MED ORDER — SODIUM CHLORIDE 0.9 % IV BOLUS
1000.0000 mL | Freq: Once | INTRAVENOUS | Status: AC
  Administered 2023-03-14: 1000 mL via INTRAVENOUS

## 2023-03-14 MED ORDER — OYSTER SHELL CALCIUM/D3 500-5 MG-MCG PO TABS: 1.0000 | ORAL_TABLET | Freq: Two times a day (BID) | ORAL | 0 refills | Status: AC

## 2023-03-14 NOTE — ED Provider Notes (Signed)
[facility]-EMERGENCY DEPT Provider Note: Lowella Dell, MD, FACEP  CSN: [id number] MRN: [id number] ARRIVAL: 03/14/23 at 0609 ROOM: MH10/MH10   CHIEF COMPLAINT  Panic Attack   HISTORY OF PRESENT ILLNESS  03/14/23 6:21 AM Natasha Cannon is a 47 y.o. female who was seen by myself earlier this shift for palpitations and other symptoms which she attributes to weaning herself off of alcohol.  She was noted to be in sinus rhythm the entire time she was here with the exception of 1 PVC.  Her blood alcohol was 194.  She states she went home after being discharged and did not have anything to eat or drink since.  About 30 minutes prior to arrival she awoke with a sense of palpitations (rapid heartbeat), anxiety, generalized muscle cramping (especially in the hands and calves) and shaking.    Past Medical History:  Diagnosis Date   Anxiety    Depression    Migraine     Past Surgical History:  Procedure Laterality Date   GASTRIC BYPASS      History reviewed. No pertinent family history.  Social History   Tobacco Use   Smoking status: Never   Smokeless tobacco: Never  Substance Use Topics   Alcohol use: Yes    Comment: occ   Drug use: Never    Prior to Admission medications   Medication Sig Start Date End Date Taking? Authorizing Provider  calcium-vitamin D (OSCAL WITH D) 500-5 MG-MCG tablet Take 1 tablet by mouth 2 (two) times daily. 03/14/23 04/13/23 Yes Alvira Monday, MD  chlordiazePOXIDE (LIBRIUM) 25 MG capsule 50mg  PO TID x 1D, then 25-50mg  PO BID X 1D, then 25-50mg  PO QD X 1D 03/14/23  Yes Alvira Monday, MD  FLUoxetine (PROZAC) 40 MG capsule  03/09/22  Yes [provider]  gabapentin (NEURONTIN) 100 MG capsule  12/31/19  Yes [provider]  loratadine (CLARITIN) 10 MG tablet Take 10 mg by mouth daily as needed for allergies.    [provider]    Allergies Penicillins   REVIEW OF SYSTEMS  Negative except as noted here or in the History of  Present Illness.   PHYSICAL EXAMINATION  Initial Vital Signs Blood pressure (!) 129/91, pulse 98, temperature 98.7 F (37.1 C), temperature source Oral, resp. rate (!) 26, height 5\' 7"  (1.702 m), weight 79.3 kg, last menstrual period 02/27/2023, SpO2 100 %.  Examination General: Well-developed, well-nourished female in no acute distress; appearance consistent with age of record HENT: normocephalic; atraumatic Eyes: Normal appearance Neck: supple Heart: regular rate and rhythm Lungs: clear to auscultation bilaterally; tachypnea Abdomen: soft; nondistended; nontender; no masses or hepatosplenomegaly; bowel sounds present Extremities: Hand spasms Neurologic: Awake, alert and oriented; motor function intact in all extremities and symmetric; no facial droop; tremulous Skin: Warm and dry Psychiatric: Anxious   RESULTS  Summary of this visit's results, reviewed and interpreted by myself:   EKG Interpretation  Date/Time:  Tuesday March 14 2023 06:40:36 EDT Ventricular Rate:  82 PR Interval:  152 QRS Duration: 111 QT Interval:  442 QTC Calculation: 517 R Axis:   76 Text Interpretation: Normal sinus rhythm Artifact Confirmed by Jordy Hewins ([id number]) on 03/14/2023 6:46:16 AM       Laboratory studies from earlier visit:  Comprehensive metabolic panel     Status: Abnormal    Collection Time: 03/13/23 10:09 PM  Result Value Ref Range    Sodium 136 135 - 145 mmol/L    Potassium 3.5 3.5 - 5.1 mmol/L    Chloride  100 98 - 111 mmol/L    CO2 22 22 - 32 mmol/L    Glucose, Bld 105 (H) 70 - 99 mg/dL    BUN 9 6 - 20 mg/dL    Creatinine, Ser 4.09 0.44 - 1.00 mg/dL    Calcium 8.1 (L) 8.9 - 10.3 mg/dL    Total Protein 7.7 6.5 - 8.1 g/dL    Albumin 4.2 3.5 - 5.0 g/dL    AST 811 (H) 15 - 41 U/L    ALT 98 (H) 0 - 44 U/L    Alkaline Phosphatase 71 38 - 126 U/L    Total Bilirubin 1.3 (H) 0.3 - 1.2 mg/dL    GFR, Estimated >91 >47 mL/min    Anion gap 14 5 - 15  Ethanol     Status: Abnormal     Collection Time: 03/13/23 10:09 PM  Result Value Ref Range    Alcohol, Ethyl (B) 194 (H) <10 mg/dL  cbc     Status: None    Collection Time: 03/13/23 10:09 PM  Result Value Ref Range    WBC 4.3 4.0 - 10.5 K/uL    RBC 4.40 3.87 - 5.11 MIL/uL    Hemoglobin 12.8 12.0 - 15.0 g/dL    HCT 82.9 56.2 - 13.0 %    MCV 88.6 80.0 - 100.0 fL    MCH 29.1 26.0 - 34.0 pg    MCHC 32.8 30.0 - 36.0 g/dL    RDW 86.5 78.4 - 69.6 %    Platelets 224 150 - 400 K/uL    nRBC 0.0 0.0 - 0.2 %  Rapid urine drug screen (hospital performed)     Status: None    Collection Time: 03/14/23 12:51 AM  Result Value Ref Range    Opiates NONE DETECTED NONE DETECTED    Cocaine NONE DETECTED NONE DETECTED    Benzodiazepines NONE DETECTED NONE DETECTED    Amphetamines NONE DETECTED NONE DETECTED    Tetrahydrocannabinol NONE DETECTED NONE DETECTED    Barbiturates NONE DETECTED NONE DETECTED  Pregnancy, urine     Status: None    Collection Time: 03/14/23 12:51 AM  Result Value Ref Range    Preg Test, Ur NEGATIVE NEGATIVE  Urinalysis, Routine w reflex microscopic -Urine, Clean Catch     Status: Abnormal    Collection Time: 03/14/23 12:51 AM  Result Value Ref Range    Color, Urine YELLOW YELLOW    APPearance CLEAR CLEAR    Specific Gravity, Urine 1.025 1.005 - 1.030    pH 5.5 5.0 - 8.0    Glucose, UA NEGATIVE NEGATIVE mg/dL    Hgb urine dipstick TRACE (A) NEGATIVE    Bilirubin Urine NEGATIVE NEGATIVE    Ketones, ur TRACE (A) NEGATIVE mg/dL    Protein, ur TRACE (A) NEGATIVE mg/dL    Nitrite NEGATIVE NEGATIVE    Leukocytes,Ua NEGATIVE NEGATIVE  Urinalysis, Microscopic (reflex)     Status: Abnormal    Collection Time: 03/14/23 12:51 AM  Result Value Ref Range    RBC / HPF 0-5 0 - 5 RBC/hpf    WBC, UA 0-5 0 - 5 WBC/hpf    Bacteria, UA FEW (A) NONE SEEN    Squamous Epithelial / HPF 0-5 0 - 5 /HPF      Laboratory Studies: Results for orders placed or performed during the hospital encounter of 03/14/23 (from the past  24 hour(s))  Ethanol     Status: None   Collection Time: 03/14/23  6:30 AM  Result Value  Ref Range   Alcohol, Ethyl (B) <10 <10 mg/dL  Calcium     Status: Abnormal   Collection Time: 03/14/23  6:40 AM  Result Value Ref Range   Calcium 7.7 (L) 8.9 - 10.3 mg/dL  CK     Status: None   Collection Time: 03/14/23  6:40 AM  Result Value Ref Range   Total CK 113 38 - 234 U/L   Imaging Studies: No results found.  ED COURSE and MDM  Nursing notes, initial and subsequent vitals signs, including pulse oximetry, reviewed and interpreted by myself.  Vitals:   03/14/23 0630 03/14/23 0700 03/14/23 0800 03/14/23 0852  BP: 125/83  113/74   Pulse: 77 69 64   Resp:  (!) 23 (!) 21   Temp:      TempSrc:      SpO2: 100% 99% 99% 100%  Weight:      Height:       Medications  LORazepam (ATIVAN) injection 2 mg (2 mg Intravenous Given 03/14/23 0623)  calcium gluconate 1 g/ 50 mL sodium chloride IVPB (0 mg Intravenous Stopped 03/14/23 0851)   6:29 AM Significant improvement with IV Ativan.  I suspect the patient is having a panic attack, possibly triggered by alcohol withdrawal although her symptoms are not typical of alcohol withdrawal.  The patient's calcium level was noted to be somewhat low on her her previous visit, 8.1.  Hypocalcemia can cause muscle spasms similar to that is seen with panic attacks so we will administer a gram of calcium gluconate IV.   6:55 AM Signed out to Dr. Dalene Seltzer.  PROCEDURES  Procedures   ED DIAGNOSES     ICD-10-CM   1. Hypocalcemia  E83.51     2. Palpitations  R00.2     3. Alcohol withdrawal syndrome without complication (HCC)  F10.930          Shelbee Apgar, Jonny Ruiz, MD 03/14/23 2247

## 2023-03-14 NOTE — ED Triage Notes (Signed)
Pt states unable to sleep since discharge, having palpitations, muscle cramps, tremors. EDP at Bedside.

## 2023-03-14 NOTE — ED Provider Notes (Signed)
[facility] Provider Note: Lowella Dell, MD, FACEP  CSN: [id number] MRN: [id number] ARRIVAL: 03/13/23 at 2154 ROOM: MH05/MH05   CHIEF COMPLAINT  Palpitations   HISTORY OF PRESENT ILLNESS  03/14/23 12:23 AM Natasha Cannon is a 47 y.o. female who is trying to cut down on alcohol consumption.  She states she last drank this morning who was trying to cut down on her alcohol consumption.  She last drank 4 liquor drinks this morning.  Her principal withdrawal symptom is palpitations by which she means a sensation that her heart is skipping or fluttering at times.  She is also having some anxiety, nausea, anorexia and dyspnea.  She has had little urine output and believes she is dehydrated.   Past Medical History:  Diagnosis Date   Anxiety    Depression    Migraine     Past Surgical History:  Procedure Laterality Date   GASTRIC BYPASS      History reviewed. No pertinent family history.  Social History   Tobacco Use   Smoking status: Never   Smokeless tobacco: Never  Substance Use Topics   Alcohol use: Yes    Comment: occ   Drug use: Never    Prior to Admission medications   Medication Sig Start Date End Date Taking? Authorizing Provider  loratadine (CLARITIN) 10 MG tablet Take 10 mg by mouth daily as needed for allergies.    [provider]    Allergies Penicillins   REVIEW OF SYSTEMS  Negative except as noted here or in the History of Present Illness.   PHYSICAL EXAMINATION  Initial Vital Signs Blood pressure (!) 133/104, pulse (!) 125, temperature 98 F (36.7 C), resp. rate 20, height 5\' 7"  (1.702 m), weight 79.4 kg, last menstrual period 02/27/2023, SpO2 99 %.  Examination General: Well-developed, well-nourished female in no acute distress; appearance consistent with age of record HENT: normocephalic; atraumatic Eyes: pupils equal, round and reactive to light; extraocular muscles intact Neck: supple Heart: regular rate and  rhythm Lungs: clear to auscultation bilaterally Abdomen: soft; nondistended; nontender; bowel sounds present Extremities: No deformity; full range of motion; pulses normal Neurologic: Awake, alert and oriented; motor function intact in all extremities and symmetric; no facial droop Skin: Warm and dry Psychiatric: Normal mood and affect   RESULTS  Summary of this visit's results, reviewed and interpreted by myself:   EKG Interpretation  Date/Time:  Tuesday March 14 2023 00:31:45 EDT Ventricular Rate:  86 PR Interval:  155 QRS Duration: 91 QT Interval:  375 QTC Calculation: 449 R Axis:   90 Text Interpretation: Sinus rhythm Borderline right axis deviation Rate has slowed Confirmed by Kainat Pizana, Jonny Ruiz ([id number]) on 03/14/2023 12:42:35 AM       Laboratory Studies: Results for orders placed or performed during the hospital encounter of 03/14/23 (from the past 24 hour(s))  Comprehensive metabolic panel     Status: Abnormal   Collection Time: 03/13/23 10:09 PM  Result Value Ref Range   Sodium 136 135 - 145 mmol/L   Potassium 3.5 3.5 - 5.1 mmol/L   Chloride 100 98 - 111 mmol/L   CO2 22 22 - 32 mmol/L   Glucose, Bld 105 (H) 70 - 99 mg/dL   BUN 9 6 - 20 mg/dL   Creatinine, Ser 6.21 0.44 - 1.00 mg/dL   Calcium 8.1 (L) 8.9 - 10.3 mg/dL   Total Protein 7.7 6.5 - 8.1 g/dL   Albumin 4.2 3.5 - 5.0 g/dL   AST 308 (H) 15 -  41 U/L   ALT 98 (H) 0 - 44 U/L   Alkaline Phosphatase 71 38 - 126 U/L   Total Bilirubin 1.3 (H) 0.3 - 1.2 mg/dL   GFR, Estimated >40 >98 mL/min   Anion gap 14 5 - 15  Ethanol     Status: Abnormal   Collection Time: 03/13/23 10:09 PM  Result Value Ref Range   Alcohol, Ethyl (B) 194 (H) <10 mg/dL  cbc     Status: None   Collection Time: 03/13/23 10:09 PM  Result Value Ref Range   WBC 4.3 4.0 - 10.5 K/uL   RBC 4.40 3.87 - 5.11 MIL/uL   Hemoglobin 12.8 12.0 - 15.0 g/dL   HCT 11.9 14.7 - 82.9 %   MCV 88.6 80.0 - 100.0 fL   MCH 29.1 26.0 - 34.0 pg   MCHC 32.8 30.0 -  36.0 g/dL   RDW 56.2 13.0 - 86.5 %   Platelets 224 150 - 400 K/uL   nRBC 0.0 0.0 - 0.2 %  Rapid urine drug screen (hospital performed)     Status: None   Collection Time: 03/14/23 12:51 AM  Result Value Ref Range   Opiates NONE DETECTED NONE DETECTED   Cocaine NONE DETECTED NONE DETECTED   Benzodiazepines NONE DETECTED NONE DETECTED   Amphetamines NONE DETECTED NONE DETECTED   Tetrahydrocannabinol NONE DETECTED NONE DETECTED   Barbiturates NONE DETECTED NONE DETECTED  Pregnancy, urine     Status: None   Collection Time: 03/14/23 12:51 AM  Result Value Ref Range   Preg Test, Ur NEGATIVE NEGATIVE  Urinalysis, Routine w reflex microscopic -Urine, Clean Catch     Status: Abnormal   Collection Time: 03/14/23 12:51 AM  Result Value Ref Range   Color, Urine YELLOW YELLOW   APPearance CLEAR CLEAR   Specific Gravity, Urine 1.025 1.005 - 1.030   pH 5.5 5.0 - 8.0   Glucose, UA NEGATIVE NEGATIVE mg/dL   Hgb urine dipstick TRACE (A) NEGATIVE   Bilirubin Urine NEGATIVE NEGATIVE   Ketones, ur TRACE (A) NEGATIVE mg/dL   Protein, ur TRACE (A) NEGATIVE mg/dL   Nitrite NEGATIVE NEGATIVE   Leukocytes,Ua NEGATIVE NEGATIVE  Urinalysis, Microscopic (reflex)     Status: Abnormal   Collection Time: 03/14/23 12:51 AM  Result Value Ref Range   RBC / HPF 0-5 0 - 5 RBC/hpf   WBC, UA 0-5 0 - 5 WBC/hpf   Bacteria, UA FEW (A) NONE SEEN   Squamous Epithelial / HPF 0-5 0 - 5 /HPF   Imaging Studies: No results found.  ED COURSE and MDM  Nursing notes, initial and subsequent vitals signs, including pulse oximetry, reviewed and interpreted by myself.  Vitals:   03/13/23 2200 03/13/23 2203 03/14/23 0051  BP: (!) 133/104  (!) 128/100  Pulse: (!) 125  89  Resp: 20  19  Temp: 98 F (36.7 C)  98.8 F (37.1 C)  TempSrc:   Oral  SpO2: 99%  99%  Weight:  79.4 kg   Height:  5\' 7"  (1.702 m)    Medications  sodium chloride 0.9 % bolus 1,000 mL (1,000 mLs Intravenous New Bag/Given 03/14/23 0049)   The  patient's first EKG, taken on arrival, showed sinus tachycardia at 125 with a single PVC.  No other PVCs have been seen on her rhythm strip but I suspect the PVC correlates with the palpitations she has been feeling.  She states she feels good right now and is ready to go home.  She is  not tremulous or having other concerning symptoms of alcohol withdrawal.  Her blood pressure is 128/100 and I suspect the diastolic is falsely elevated.  Her urinalysis does not show urinary tract infection and she was given IV fluids in the ED.   PROCEDURES  Procedures   ED DIAGNOSES     ICD-10-CM   1. PVC (premature ventricular contraction)  I49.3     2. Dehydration  E86.0     3. Alcoholic intoxication without complication (HCC)  F10.920          Danijela Vessey, Jonny Ruiz, MD 03/14/23 [phone]
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
## 2023-07-28 ENCOUNTER — Telehealth (HOSPITAL_COMMUNITY): Payer: Self-pay | Admitting: Licensed Clinical Social Worker

## 2023-07-28 NOTE — Telephone Encounter (Signed)
The therapist returns Natasha Cannon's call confirming her identity via two identifiers. She is interested in [facility] for alcohol abuse. She drank a bottle of wine yesterday and did not drink before that until last Friday. She says that she can go on binges for days. She says that she was in detox last summer.  The therapist schedules to see her for a CCA on 07/31/23 at 9 a.m.  Myrna Blazer, MA, LCSW, [facility], LCAS 07/28/2023

## 2023-07-31 ENCOUNTER — Telehealth (HOSPITAL_COMMUNITY): Payer: Self-pay | Admitting: Licensed Clinical Social Worker

## 2023-07-31 ENCOUNTER — Ambulatory Visit (HOSPITAL_COMMUNITY)

## 2023-07-31 NOTE — Telephone Encounter (Signed)
The therapist attempts to reach [facility] by phone in response to her no show for assessment today at 9 a.m. leaving a HIPAA-compliant voicemail with his direct callback number.  Natasha Blazer, MA, LCSW, [facility], LCAS 07/31/2023

## 2024-07-11 ENCOUNTER — Other Ambulatory Visit (HOSPITAL_COMMUNITY): Payer: Self-pay

## 2024-12-04 ENCOUNTER — Emergency Department (HOSPITAL_COMMUNITY)
Admission: EM | Admit: 2024-12-04 | Discharge: 2024-12-05 | Disposition: A | Discharge status: Home / Self Care | Attending: Emergency Medicine | Admitting: Emergency Medicine

## 2024-12-04 ENCOUNTER — Other Ambulatory Visit: Payer: Self-pay

## 2024-12-04 ENCOUNTER — Encounter (HOSPITAL_COMMUNITY): Payer: Self-pay

## 2024-12-04 ENCOUNTER — Emergency Department (HOSPITAL_COMMUNITY)

## 2024-12-04 DIAGNOSIS — S0990XA Unspecified injury of head, initial encounter: Secondary | ICD-10-CM | POA: Diagnosis present

## 2024-12-04 DIAGNOSIS — M542 Cervicalgia: Secondary | ICD-10-CM | POA: Diagnosis not present

## 2024-12-04 DIAGNOSIS — Y92410 Unspecified street and highway as the place of occurrence of the external cause: Secondary | ICD-10-CM | POA: Diagnosis not present

## 2024-12-04 NOTE — ED Notes (Signed)
 Ice packs applied to pt left neck and left lower back

## 2024-12-04 NOTE — ED Notes (Signed)
 Pt ok to go to [facility] per Dr Patt.
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
## 2024-12-05 ENCOUNTER — Emergency Department (HOSPITAL_COMMUNITY)

## 2024-12-05 MED ORDER — OXYCODONE-ACETAMINOPHEN 5-325 MG PO TABS
1.0000 | ORAL_TABLET | Freq: Once | ORAL | Status: AC
  Administered 2024-12-05: 1 via ORAL
  Filled 2024-12-05: qty 1

## 2024-12-05 MED ORDER — METHOCARBAMOL 500 MG PO TABS: 500.0000 mg | ORAL_TABLET | Freq: Two times a day (BID) | ORAL | 0 refills | Status: AC

## 2024-12-05 MED ORDER — NAPROXEN 250 MG PO TABS
500.0000 mg | ORAL_TABLET | Freq: Once | ORAL | Status: AC
  Administered 2024-12-05: 500 mg via ORAL
  Filled 2024-12-05: qty 2

## 2024-12-05 MED ORDER — MELOXICAM 7.5 MG PO TABS: 7.5000 mg | ORAL_TABLET | Freq: Every day | ORAL | 0 refills | Status: AC

## 2024-12-05 MED ORDER — LIDOCAINE 5 % EX PTCH
1.0000 | MEDICATED_PATCH | CUTANEOUS | Status: DC
  Administered 2024-12-05: 1 via TRANSDERMAL
  Filled 2024-12-05: qty 1
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
